# Patient Record
Sex: Male | Born: 1990 | Race: White | Hispanic: No | Marital: Single
Health system: Southern US, Community
[De-identification: ages and names within clinical notes are randomized; demographics above are authoritative.]

---
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
## 2009-01-30 ENCOUNTER — Emergency Department (HOSPITAL_COMMUNITY): Admission: EM | Admit: 2009-01-30 | Discharge: 2009-01-30 | Payer: Self-pay | Admitting: Family Medicine

## 2010-01-07 ENCOUNTER — Emergency Department (HOSPITAL_COMMUNITY): Admission: EM | Admit: 2010-01-07 | Discharge: 2010-01-07 | Payer: Self-pay | Admitting: Emergency Medicine

## 2010-07-20 ENCOUNTER — Emergency Department (HOSPITAL_COMMUNITY)
Admission: EM | Admit: 2010-07-20 | Discharge: 2010-07-20 | Payer: Self-pay | Source: Home / Self Care | Admitting: Emergency Medicine

## 2014-05-10 ENCOUNTER — Ambulatory Visit: Payer: Self-pay | Admitting: Sports Medicine

## 2021-12-18 ENCOUNTER — Emergency Department (HOSPITAL_BASED_OUTPATIENT_CLINIC_OR_DEPARTMENT_OTHER): Payer: BLUE CROSS/BLUE SHIELD | Admitting: Radiology

## 2021-12-18 ENCOUNTER — Other Ambulatory Visit: Payer: Self-pay

## 2021-12-18 ENCOUNTER — Emergency Department (HOSPITAL_BASED_OUTPATIENT_CLINIC_OR_DEPARTMENT_OTHER)
Admission: EM | Admit: 2021-12-18 | Discharge: 2021-12-18 | Disposition: A | Discharge status: Home / Self Care | Payer: BLUE CROSS/BLUE SHIELD | Attending: Emergency Medicine | Admitting: Emergency Medicine

## 2021-12-18 DIAGNOSIS — R519 Headache, unspecified: Secondary | ICD-10-CM | POA: Diagnosis not present

## 2021-12-18 DIAGNOSIS — S39012A Strain of muscle, fascia and tendon of lower back, initial encounter: Secondary | ICD-10-CM | POA: Diagnosis not present

## 2021-12-18 DIAGNOSIS — Y92410 Unspecified street and highway as the place of occurrence of the external cause: Secondary | ICD-10-CM | POA: Diagnosis not present

## 2021-12-18 DIAGNOSIS — S3992XA Unspecified injury of lower back, initial encounter: Secondary | ICD-10-CM | POA: Diagnosis present
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
## 2021-12-18 NOTE — Discharge Instructions (Signed)
Follow-up with your primary care doctor if your symptoms are not improving.  Return to the emergency room if you have any worsening symptoms. 
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
